# Patient Record
Sex: Male | Born: 1990 | Race: Black or African American | Hispanic: No | Marital: Single | Smoking: Current every day smoker
Health system: Southern US, Community
[De-identification: ages and names within clinical notes are randomized; demographics above are authoritative.]

## PROBLEM LIST (undated history)

## (undated) ENCOUNTER — Emergency Department (HOSPITAL_COMMUNITY): Admission: EM | Payer: 59 | Source: Home / Self Care

---

## 2017-02-05 ENCOUNTER — Encounter (HOSPITAL_COMMUNITY): Payer: Self-pay | Admitting: *Deleted

## 2017-02-05 ENCOUNTER — Emergency Department (HOSPITAL_COMMUNITY): Payer: 59

## 2017-02-05 ENCOUNTER — Emergency Department (HOSPITAL_COMMUNITY)
Admission: EM | Admit: 2017-02-05 | Discharge: 2017-02-05 | Disposition: A | Discharge status: Home / Self Care | Payer: 59 | Attending: Emergency Medicine | Admitting: Emergency Medicine

## 2017-02-05 DIAGNOSIS — S134XXA Sprain of ligaments of cervical spine, initial encounter: Secondary | ICD-10-CM | POA: Insufficient documentation

## 2017-02-05 DIAGNOSIS — W109XXA Fall (on) (from) unspecified stairs and steps, initial encounter: Secondary | ICD-10-CM | POA: Insufficient documentation

## 2017-02-05 DIAGNOSIS — Y939 Activity, unspecified: Secondary | ICD-10-CM | POA: Diagnosis not present

## 2017-02-05 DIAGNOSIS — F17210 Nicotine dependence, cigarettes, uncomplicated: Secondary | ICD-10-CM | POA: Diagnosis not present

## 2017-02-05 DIAGNOSIS — S46912A Strain of unspecified muscle, fascia and tendon at shoulder and upper arm level, left arm, initial encounter: Secondary | ICD-10-CM

## 2017-02-05 DIAGNOSIS — Y999 Unspecified external cause status: Secondary | ICD-10-CM | POA: Insufficient documentation

## 2017-02-05 DIAGNOSIS — S139XXA Sprain of joints and ligaments of unspecified parts of neck, initial encounter: Secondary | ICD-10-CM

## 2017-02-05 DIAGNOSIS — Y929 Unspecified place or not applicable: Secondary | ICD-10-CM | POA: Diagnosis not present

## 2017-02-05 DIAGNOSIS — S4992XA Unspecified injury of left shoulder and upper arm, initial encounter: Secondary | ICD-10-CM | POA: Diagnosis present

## 2017-02-05 MED ORDER — IBUPROFEN 800 MG PO TABS: 800.0000 mg | ORAL_TABLET | Freq: Three times a day (TID) | ORAL | 0 refills | Status: DC

## 2017-02-05 MED ORDER — CYCLOBENZAPRINE HCL 10 MG PO TABS: 10.0000 mg | ORAL_TABLET | Freq: Two times a day (BID) | ORAL | 0 refills | Status: AC | PRN

## 2017-02-05 MED ORDER — OXYCODONE-ACETAMINOPHEN 5-325 MG PO TABS
ORAL_TABLET | ORAL | Status: AC
  Filled 2017-02-05: qty 1

## 2017-02-05 MED ORDER — OXYCODONE-ACETAMINOPHEN 5-325 MG PO TABS
1.0000 | ORAL_TABLET | ORAL | Status: DC | PRN
  Administered 2017-02-05: 1 via ORAL

## 2017-02-05 NOTE — ED Notes (Signed)
Ortho Tech paged awaiting return call 

## 2017-02-05 NOTE — ED Triage Notes (Signed)
Pt c/o neck pain & L shoulder pain post falling down steps yesterday, pt denies LOC, pt ambulatory, pt able to move L arm with pain, +PS, A&O x4, denies bowel & bladder incontinence

## 2017-02-05 NOTE — ED Provider Notes (Signed)
[facility]-EMERGENCY DEPT Provider Note   CSN: [id number] Arrival date & time: 02/05/17  0908   By signing my name below, I, Teofilo PodMatthew P. Jamison, attest that this documentation has been prepared under the direction and in the presence of Fayrene HelperBowie Darien Kading, PA-C. Electronically Signed: Teofilo PodMatthew P. Jamison, ED Scribe. 02/05/2017. 11:46 AM.   History   Chief Complaint Chief Complaint  Patient presents with  . Shoulder Injury    The history is provided by the patient. No language interpreter was used.   HPI Comments:  Dalton Nunez is a 26 y.o. male who presents to the Emergency Department complaining of constant left shoulder pain and neck pain due to a fall that occurred yesterday. Pt reports that he fell down 10-12 steps stairs after slipping on the first step. No precipitating sxs prior to the fall.  Pt landed on his left side and did not hit his head. Pt states that the pain caused him to have difficulty sleeping last night so he came to the ED. Pt rates his pain at 8/10 and describes the pain as "sharp." Pt has taken percocet with no relief. No known allergy to medication. Pt denies lightheadedness, dizziness, LOC,   History reviewed. No pertinent past medical history.  There are no active problems to display for this patient.   History reviewed. No pertinent surgical history.     Home Medications    Prior to Admission medications   Not on File    Family History No family history on file.  Social History Social History  Substance Use Topics  . Smoking status: Current Every Day Smoker    Packs/day: 0.25    Types: Cigarettes  . Smokeless tobacco: Never Used  . Alcohol use No     Allergies   Patient has no known allergies.   Review of Systems Review of Systems  Musculoskeletal: Positive for arthralgias, myalgias and neck pain.  Neurological: Negative for dizziness, syncope and light-headedness.     Physical Exam Updated Vital Signs BP 120/74 (BP Location: Right Arm)    Pulse 72   Temp 98.1 F (36.7 C) (Oral)   Resp 14   Ht 5\' 11"  (1.803 m)   Wt 125 lb (56.7 kg)   SpO2 100%   BMI 17.43 kg/m   Physical Exam  Constitutional: He appears well-developed and well-nourished. No distress.  HENT:  Head: Normocephalic and atraumatic.  Eyes: Conjunctivae are normal.  Cardiovascular: Normal rate.   Pulmonary/Chest: Effort normal.  Abdominal: He exhibits no distension.  Musculoskeletal:  Tenderness to cervical spine at level of C5 to C7. TTP to left paraspinal muscle, left trapezius, left lateral deltoid, left posterior shoulder. Left chest wall without any significant bruising. Left shoulder with decreased shoulder flexions to 90 degree.   Neurological: He is alert.  Skin: Skin is warm and dry.  Psychiatric: He has a normal mood and affect.  Nursing note and vitals reviewed.    ED Treatments / Results  DIAGNOSTIC STUDIES:  Oxygen Saturation is 100% on RA, normal by my interpretation.    COORDINATION OF CARE:  11:46 AM Will order sling and soft collar. Will order antiinflammatory and muscle relaxer. Discussed treatment plan with pt at bedside and pt agreed to plan.   Labs (all labs ordered are listed, but only abnormal results are displayed) Labs Reviewed - No data to display  EKG  EKG Interpretation None       Radiology Dg Cervical Spine Complete  Result Date: 02/05/2017 CLINICAL DATA:  Pain following  fall down stairs EXAM: CERVICAL SPINE - COMPLETE 4+ VIEW COMPARISON:  None. FINDINGS: Frontal, lateral, open-mouth odontoid, and bilateral oblique views were obtained with the patient's neck in collar. There is no fracture or spondylolisthesis. Prevertebral soft tissues and predental space regions are normal. The disc spaces are normal. There is no appreciable exit foraminal narrowing on the oblique views. Lung apices are clear. IMPRESSION: No evident fracture or spondylolisthesis. No apparent arthropathy. Note that no attempt to assess for  potential ligamentous injury can be made with in collar only images. Electronically Signed   By: Bretta Bang III M.D.   On: 02/05/2017 10:30   Dg Shoulder Left  Result Date: 02/05/2017 CLINICAL DATA:  Injury to the left shoulder with continued pain since a fall down stairs yesterday. Initial encounter. EXAM: LEFT SHOULDER - 2+ VIEW COMPARISON:  None. FINDINGS: There is no evidence of fracture or dislocation. There is no evidence of arthropathy or other focal bone abnormality. Soft tissues are unremarkable. IMPRESSION: Normal exam. Electronically Signed   By: Drusilla Kanner M.D.   On: 02/05/2017 10:27    Procedures Procedures (including critical care time)  Medications Ordered in ED Medications  oxyCODONE-acetaminophen (PERCOCET/ROXICET) 5-325 MG per tablet 1 tablet (1 tablet Oral Given 02/05/17 0934)  oxyCODONE-acetaminophen (PERCOCET/ROXICET) 5-325 MG per tablet (not administered)     Initial Impression / Assessment and Plan / ED Course  I have reviewed the triage vital signs and the nursing notes.  Pertinent labs & imaging results that were available during my care of the patient were reviewed by me and considered in my medical decision making (see chart for details).     BP 120/74 (BP Location: Right Arm)   Pulse 72   Temp 98.1 F (36.7 C) (Oral)   Resp 14   Ht 5\' 11"  (1.803 m)   Wt 56.7 kg   SpO2 100%   BMI 17.43 kg/m    Final Clinical Impressions(s) / ED Diagnoses   Final diagnoses:  Cervical sprain, initial encounter  Left shoulder strain, initial encounter    New Prescriptions New Prescriptions   CYCLOBENZAPRINE (FLEXERIL) 10 MG TABLET    Take 1 tablet (10 mg total) by mouth 2 (two) times daily as needed for muscle spasms.   IBUPROFEN (ADVIL,MOTRIN) 800 MG TABLET    Take 1 tablet (800 mg total) by mouth 3 (three) times daily.   the patient had a mechanical fall.  Xray of cspine and L shoulder without acute finding.  Suspect MSK injury.  Pt is NVI.  Cervical  soft collar and sling provided for support.  RICE therapy discussed.  Ortho referral given as needed.  Return precaution discussed.  I personally performed the services described in this documentation, which was scribed in my presence. The recorded information has been reviewed and is accurate.       Fayrene Helper, PA-C 02/05/17 9604    Margarita Grizzle, MD 02/07/17 1106

## 2017-02-05 NOTE — Progress Notes (Signed)
Orthopedic Tech Progress Note Patient Details:  Karna DupesJaypolie Mulroy 12/15/1991 [id number]  Ortho Devices Type of Ortho Device: Arm sling, Soft collar Ortho Device/Splint Location: lue/neck Ortho Device/Splint Interventions: Application   Kimika Streater 02/05/2017, 1:05 PM Viewed order from doctor's list

## 2017-02-05 NOTE — ED Notes (Signed)
Pt states he understands instructions. Home stable with steady gait. 

## 2017-02-05 NOTE — ED Notes (Signed)
Spoke with Rembrandt, Ortho Tech and will see patient in the ED

## 2017-02-05 NOTE — ED Notes (Signed)
Pt unable to sign due to computer. Verbalizes understanding of instruxction.

## 2018-12-30 ENCOUNTER — Encounter (HOSPITAL_COMMUNITY): Payer: Self-pay | Admitting: Emergency Medicine

## 2018-12-30 ENCOUNTER — Emergency Department (HOSPITAL_COMMUNITY)
Admission: EM | Admit: 2018-12-30 | Discharge: 2018-12-30 | Disposition: A | Discharge status: Home / Self Care | Payer: No Typology Code available for payment source | Attending: Emergency Medicine | Admitting: Emergency Medicine

## 2018-12-30 ENCOUNTER — Other Ambulatory Visit: Payer: Self-pay

## 2018-12-30 ENCOUNTER — Emergency Department (HOSPITAL_COMMUNITY): Payer: No Typology Code available for payment source

## 2018-12-30 DIAGNOSIS — S86819A Strain of other muscle(s) and tendon(s) at lower leg level, unspecified leg, initial encounter: Secondary | ICD-10-CM

## 2018-12-30 DIAGNOSIS — F17210 Nicotine dependence, cigarettes, uncomplicated: Secondary | ICD-10-CM | POA: Diagnosis not present

## 2018-12-30 DIAGNOSIS — S8992XA Unspecified injury of left lower leg, initial encounter: Secondary | ICD-10-CM | POA: Diagnosis present

## 2018-12-30 DIAGNOSIS — Y939 Activity, unspecified: Secondary | ICD-10-CM | POA: Insufficient documentation

## 2018-12-30 DIAGNOSIS — S86912A Strain of unspecified muscle(s) and tendon(s) at lower leg level, left leg, initial encounter: Secondary | ICD-10-CM | POA: Diagnosis not present

## 2018-12-30 DIAGNOSIS — Y92410 Unspecified street and highway as the place of occurrence of the external cause: Secondary | ICD-10-CM | POA: Diagnosis not present

## 2018-12-30 DIAGNOSIS — S8002XA Contusion of left knee, initial encounter: Secondary | ICD-10-CM | POA: Diagnosis not present

## 2018-12-30 DIAGNOSIS — Y998 Other external cause status: Secondary | ICD-10-CM | POA: Insufficient documentation

## 2018-12-30 MED ORDER — IBUPROFEN 800 MG PO TABS: 800.0000 mg | ORAL_TABLET | Freq: Three times a day (TID) | ORAL | 0 refills | Status: AC | PRN

## 2018-12-30 MED ORDER — METHOCARBAMOL 500 MG PO TABS: 500.0000 mg | ORAL_TABLET | Freq: Three times a day (TID) | ORAL | 0 refills | Status: AC | PRN

## 2018-12-30 MED ORDER — IBUPROFEN 800 MG PO TABS
800.0000 mg | ORAL_TABLET | Freq: Once | ORAL | Status: AC
Start: 2018-12-30 — End: 2018-12-30
  Administered 2018-12-30: 800 mg via ORAL
  Filled 2018-12-30: qty 1

## 2018-12-30 NOTE — Discharge Instructions (Signed)
You were seen in the emergency department for pain in your right calf and left knee.  Your x-rays did not show any obvious fractures.  This is likely muscle strain and bruise.  We are prescribing you some ibuprofen and a muscle relaxant.  You can use ice to the affected areas.  Please follow-up with your doctor return if any worsening symptoms.

## 2018-12-30 NOTE — ED Notes (Signed)
Patient transported to x-ray. ?
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
## 2018-12-30 NOTE — ED Notes (Signed)
Patient verbalized understanding of discharge instructions and denies any further needs or questions at this time. VS stable. Patient ambulatory with steady gait.  
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
